# Patient Record
Sex: Female | Born: 1945 | Race: White | Hispanic: No | Marital: Single | Smoking: Former smoker
Health system: Southern US, Community
[De-identification: ages and names within clinical notes are randomized; demographics above are authoritative.]

## PROBLEM LIST (undated history)

## (undated) DIAGNOSIS — F419 Anxiety disorder, unspecified: Secondary | ICD-10-CM

## (undated) DIAGNOSIS — I10 Essential (primary) hypertension: Secondary | ICD-10-CM

## (undated) DIAGNOSIS — K219 Gastro-esophageal reflux disease without esophagitis: Secondary | ICD-10-CM

## (undated) DIAGNOSIS — E7800 Pure hypercholesterolemia, unspecified: Secondary | ICD-10-CM

## (undated) HISTORY — PX: BREAST SURGERY: SHX581

---

## 1997-12-23 ENCOUNTER — Other Ambulatory Visit: Admission: RE | Admit: 1997-12-23 | Discharge: 1997-12-23 | Payer: Self-pay | Admitting: *Deleted

## 1998-02-19 ENCOUNTER — Other Ambulatory Visit: Admission: RE | Admit: 1998-02-19 | Discharge: 1998-02-19 | Payer: Self-pay | Admitting: *Deleted

## 1998-12-29 ENCOUNTER — Other Ambulatory Visit: Admission: RE | Admit: 1998-12-29 | Discharge: 1998-12-29 | Payer: Self-pay | Admitting: *Deleted

## 2000-01-04 ENCOUNTER — Other Ambulatory Visit: Admission: RE | Admit: 2000-01-04 | Discharge: 2000-01-04 | Payer: Self-pay | Admitting: *Deleted

## 2000-01-04 ENCOUNTER — Encounter: Admission: RE | Admit: 2000-01-04 | Discharge: 2000-01-04 | Payer: Self-pay | Admitting: *Deleted

## 2000-01-04 ENCOUNTER — Encounter: Payer: Self-pay | Admitting: *Deleted

## 2001-01-28 ENCOUNTER — Other Ambulatory Visit: Admission: RE | Admit: 2001-01-28 | Discharge: 2001-01-28 | Payer: Self-pay | Admitting: *Deleted

## 2002-02-03 ENCOUNTER — Other Ambulatory Visit: Admission: RE | Admit: 2002-02-03 | Discharge: 2002-02-03 | Payer: Self-pay | Admitting: *Deleted

## 2003-02-11 ENCOUNTER — Other Ambulatory Visit: Admission: RE | Admit: 2003-02-11 | Discharge: 2003-02-11 | Payer: Self-pay | Admitting: *Deleted

## 2003-03-26 ENCOUNTER — Encounter: Admission: RE | Admit: 2003-03-26 | Discharge: 2003-03-26 | Payer: Self-pay | Admitting: *Deleted

## 2004-02-24 ENCOUNTER — Other Ambulatory Visit: Admission: RE | Admit: 2004-02-24 | Discharge: 2004-02-24 | Payer: Self-pay | Admitting: *Deleted

## 2005-05-29 ENCOUNTER — Other Ambulatory Visit: Admission: RE | Admit: 2005-05-29 | Discharge: 2005-05-29 | Payer: Self-pay | Admitting: *Deleted

## 2005-06-14 ENCOUNTER — Encounter: Admission: RE | Admit: 2005-06-14 | Discharge: 2005-06-14 | Payer: Self-pay | Admitting: *Deleted

## 2005-08-04 ENCOUNTER — Encounter: Admission: RE | Admit: 2005-08-04 | Discharge: 2005-08-04 | Payer: Self-pay | Admitting: Family Medicine

## 2006-05-30 ENCOUNTER — Other Ambulatory Visit: Admission: RE | Admit: 2006-05-30 | Discharge: 2006-05-30 | Payer: Self-pay | Admitting: *Deleted

## 2006-07-18 ENCOUNTER — Encounter: Admission: RE | Admit: 2006-07-18 | Discharge: 2006-07-18 | Payer: Self-pay | Admitting: *Deleted

## 2006-08-29 ENCOUNTER — Encounter: Admission: RE | Admit: 2006-08-29 | Discharge: 2006-08-29 | Payer: Self-pay | Admitting: *Deleted

## 2007-06-06 ENCOUNTER — Other Ambulatory Visit: Admission: RE | Admit: 2007-06-06 | Discharge: 2007-06-06 | Payer: Self-pay | Admitting: *Deleted

## 2007-07-24 ENCOUNTER — Encounter: Admission: RE | Admit: 2007-07-24 | Discharge: 2007-07-24 | Payer: Self-pay | Admitting: *Deleted

## 2008-04-22 IMAGING — MG MM SCREEN MAMMOGRAM BILATERAL
6 series · 6 of 6 positions shown · non-contrast
Comparison: none

DG SCREEN MAMMOGRAM BILATERAL
Bilateral CC and MLO view(s) were taken.

DIGITAL SCREENING MAMMOGRAM WITH CAD:
There is a fibrofatty pattern.  No masses or malignant type calcifications are identified.  
Compared with prior studies.

[R CC]
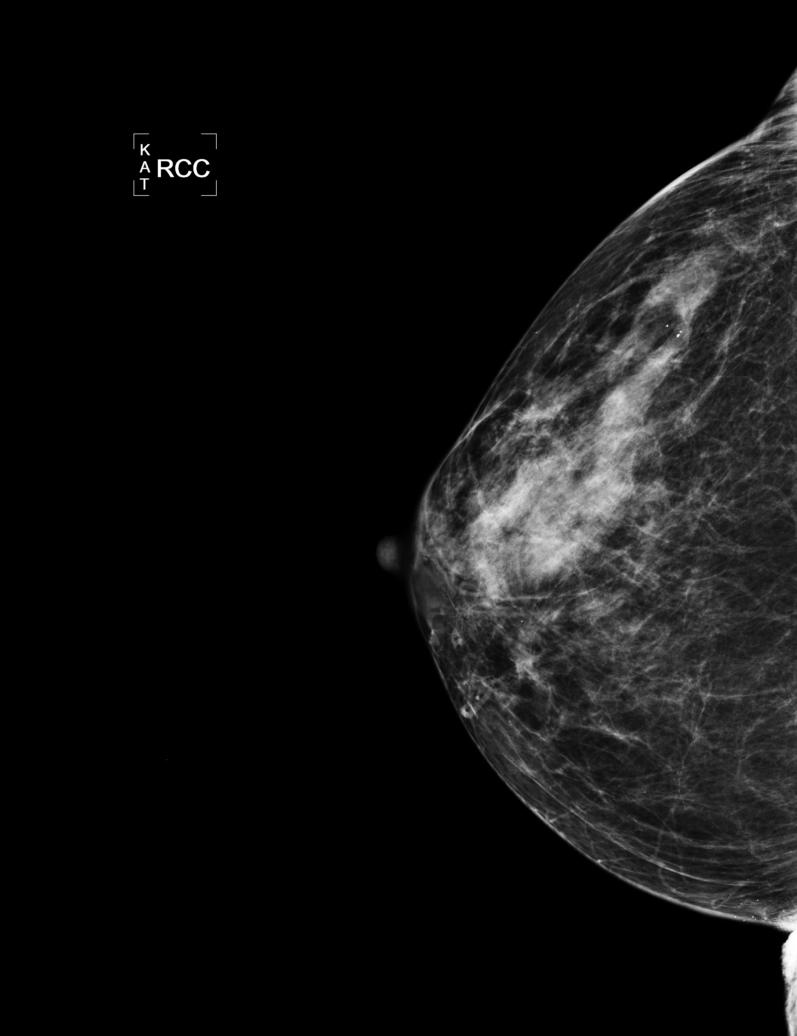

[L CC]
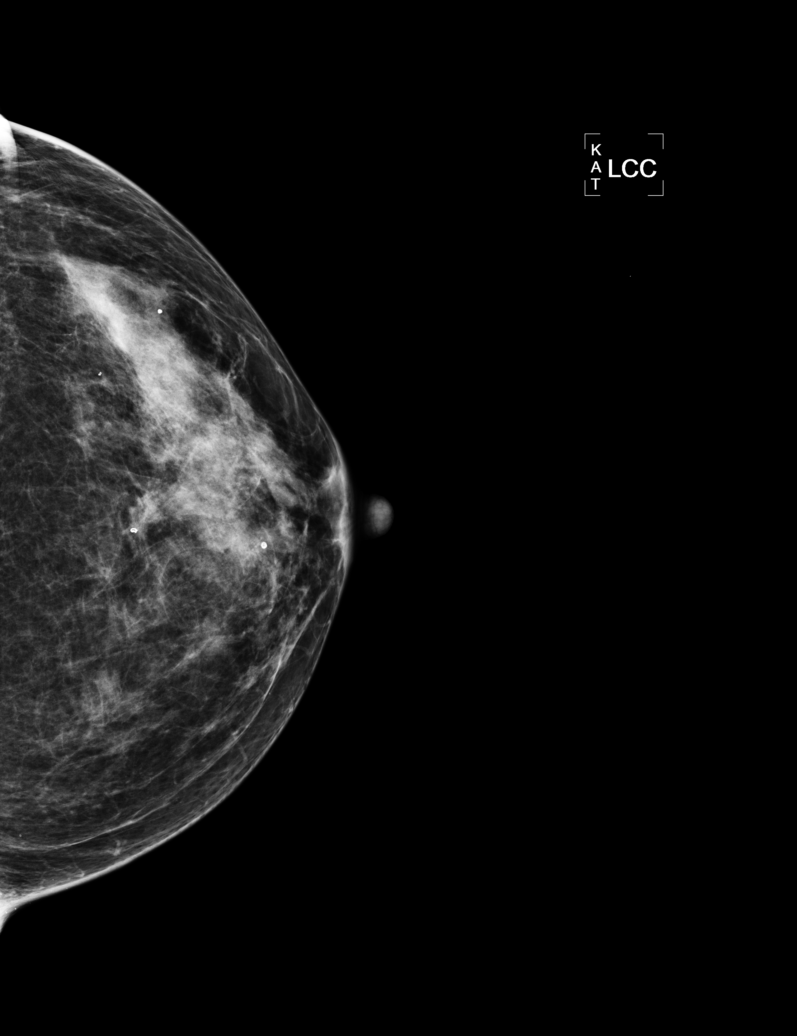

[L MLO (1 of 2)]
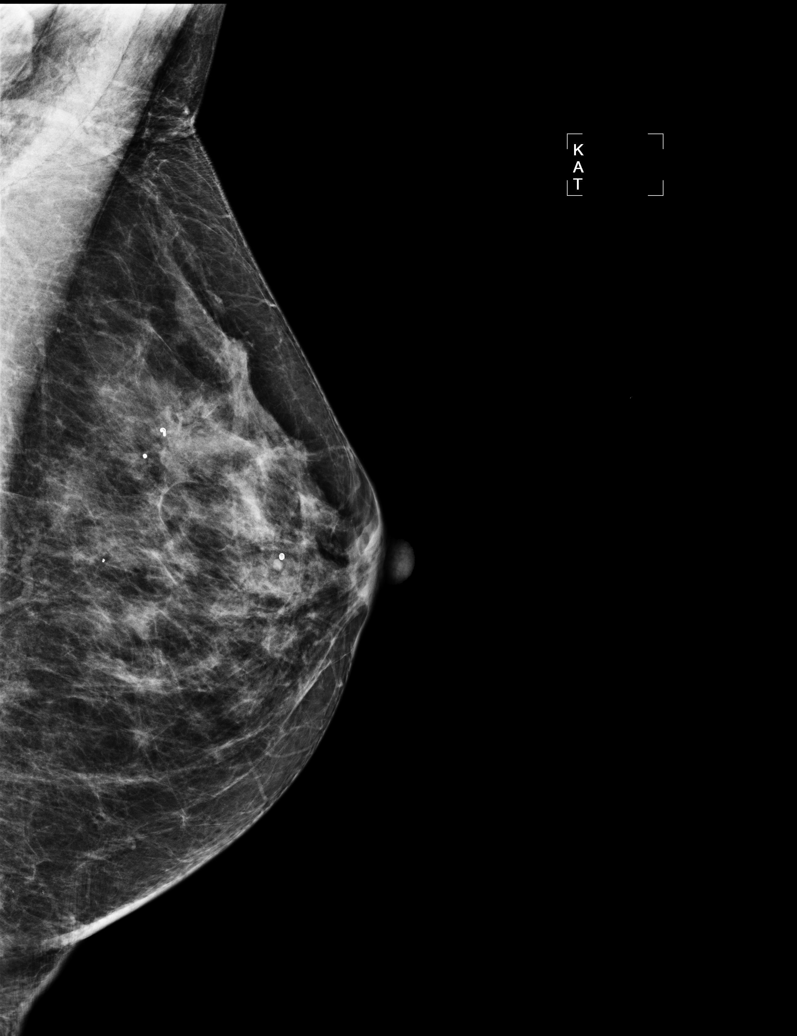

[R MLO (1 of 2)]
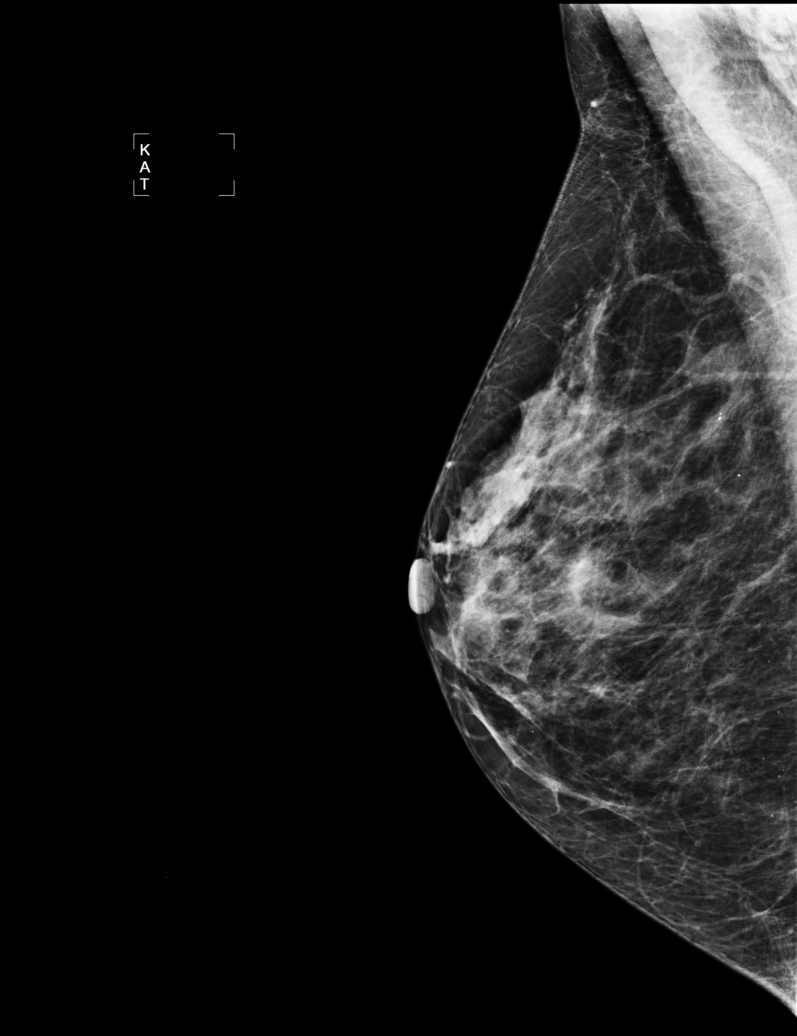

[R MLO (2 of 2)]
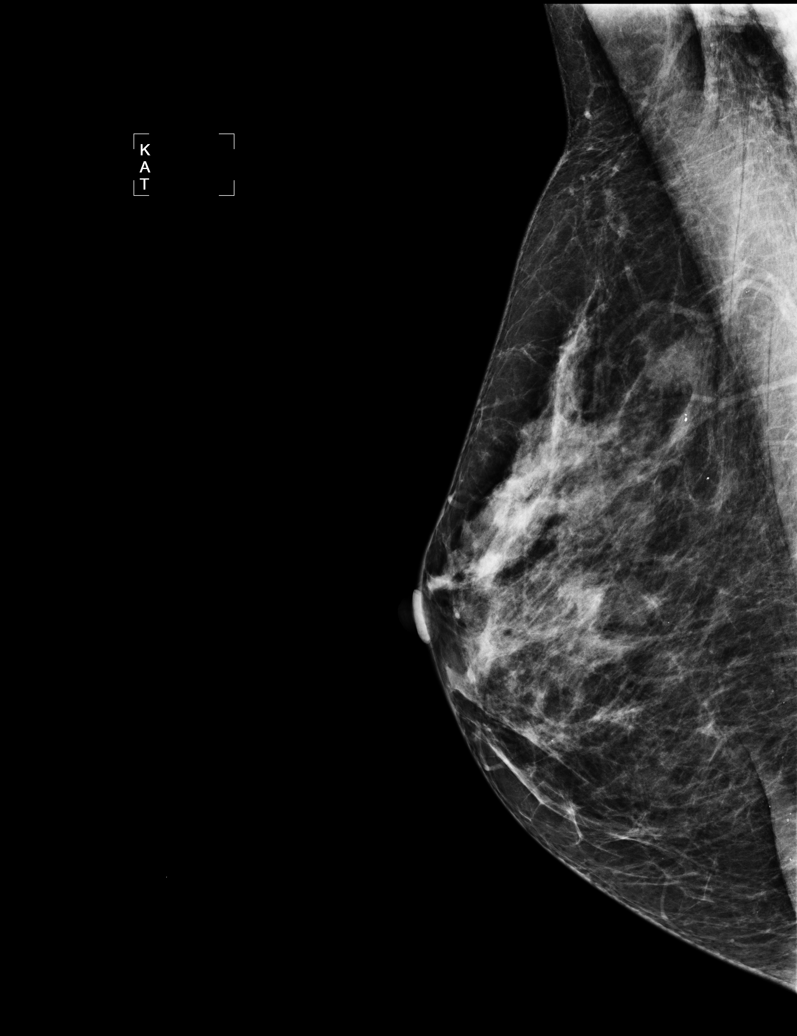

[L MLO (2 of 2)]
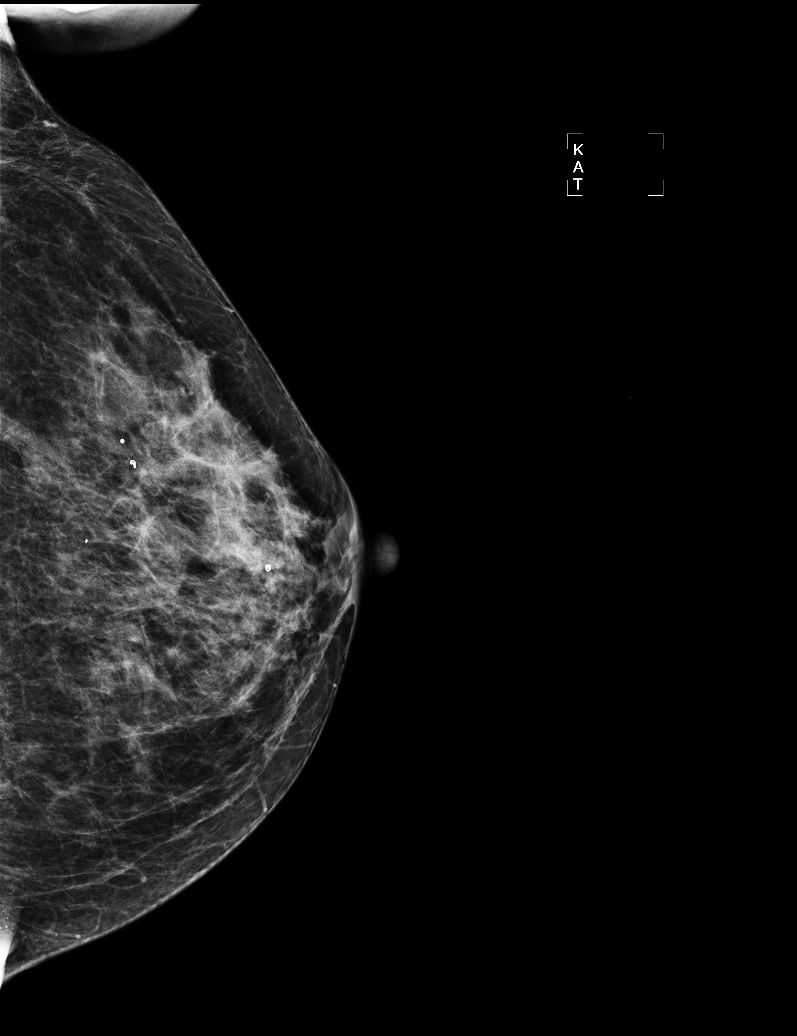

[6 of 6 positions shown; findings below may reference images not displayed]

IMPRESSION: No specific mammographic evidence of malignancy.  Next screening mammogram is recommended in one 
year.

ASSESSMENT: Negative - BI-RADS 1

Screening mammogram in 1 year.
ANALYZED BY COMPUTER AIDED DETECTION. , THIS PROCEDURE WAS A DIGITAL MAMMOGRAM.

## 2008-06-18 ENCOUNTER — Other Ambulatory Visit: Admission: RE | Admit: 2008-06-18 | Discharge: 2008-06-18 | Payer: Self-pay | Admitting: Obstetrics & Gynecology

## 2008-09-09 ENCOUNTER — Encounter: Admission: RE | Admit: 2008-09-09 | Discharge: 2008-09-09 | Payer: Self-pay | Admitting: Obstetrics & Gynecology

## 2008-09-29 ENCOUNTER — Ambulatory Visit (HOSPITAL_BASED_OUTPATIENT_CLINIC_OR_DEPARTMENT_OTHER): Admission: RE | Admit: 2008-09-29 | Discharge: 2008-09-29 | Payer: Self-pay | Admitting: Obstetrics & Gynecology

## 2008-09-29 ENCOUNTER — Ambulatory Visit: Payer: Self-pay | Admitting: Diagnostic Radiology

## 2008-11-22 ENCOUNTER — Ambulatory Visit: Payer: Self-pay | Admitting: Diagnostic Radiology

## 2008-11-22 ENCOUNTER — Emergency Department (HOSPITAL_BASED_OUTPATIENT_CLINIC_OR_DEPARTMENT_OTHER): Admission: EM | Admit: 2008-11-22 | Discharge: 2008-11-22 | Payer: Self-pay | Admitting: Emergency Medicine

## 2009-10-06 ENCOUNTER — Encounter: Admission: RE | Admit: 2009-10-06 | Discharge: 2009-10-06 | Payer: Self-pay | Admitting: Obstetrics & Gynecology

## 2009-10-06 ENCOUNTER — Ambulatory Visit: Payer: Self-pay | Admitting: Radiology

## 2009-10-06 ENCOUNTER — Ambulatory Visit (HOSPITAL_BASED_OUTPATIENT_CLINIC_OR_DEPARTMENT_OTHER): Admission: RE | Admit: 2009-10-06 | Discharge: 2009-10-06 | Payer: Self-pay | Admitting: Obstetrics & Gynecology

## 2010-08-14 LAB — DIFFERENTIAL
Basophils Relative: 1 % (ref 0–1)
Eosinophils Absolute: 0 10*3/uL (ref 0.0–0.7)
Lymphocytes Relative: 19 % (ref 12–46)
Lymphs Abs: 1 10*3/uL (ref 0.7–4.0)
Neutro Abs: 3.9 10*3/uL (ref 1.7–7.7)

## 2010-08-14 LAB — BASIC METABOLIC PANEL
BUN: 19 mg/dL (ref 6–23)
Calcium: 9.8 mg/dL (ref 8.4–10.5)
Chloride: 105 mEq/L (ref 96–112)
Creatinine, Ser: 0.7 mg/dL (ref 0.4–1.2)
GFR calc Af Amer: >60 mL/min (ref >60)
Potassium: 4.1 mEq/L (ref 3.5–5.1)
Sodium: 143 mEq/L (ref 135–145)

## 2010-08-14 LAB — CBC
Hemoglobin: 14.4 g/dL (ref 12.0–15.0)
MCHC: 33.2 g/dL (ref 30.0–36.0)
RBC: 4.72 MIL/uL (ref 3.87–5.11)
WBC: 5.3 10*3/uL (ref 4.0–10.5)

## 2010-08-15 ENCOUNTER — Other Ambulatory Visit: Payer: Self-pay | Admitting: Obstetrics & Gynecology

## 2010-08-15 DIAGNOSIS — Z1231 Encounter for screening mammogram for malignant neoplasm of breast: Secondary | ICD-10-CM

## 2010-08-17 ENCOUNTER — Other Ambulatory Visit: Payer: Self-pay | Admitting: Obstetrics & Gynecology

## 2010-08-17 DIAGNOSIS — Z1231 Encounter for screening mammogram for malignant neoplasm of breast: Secondary | ICD-10-CM

## 2010-10-19 ENCOUNTER — Ambulatory Visit
Admission: RE | Admit: 2010-10-19 | Discharge: 2010-10-19 | Disposition: A | Discharge status: Home / Self Care | Payer: Medicare Other | Source: Ambulatory Visit | Attending: Obstetrics & Gynecology | Admitting: Obstetrics & Gynecology

## 2010-10-19 DIAGNOSIS — Z1231 Encounter for screening mammogram for malignant neoplasm of breast: Secondary | ICD-10-CM

## 2011-07-20 ENCOUNTER — Other Ambulatory Visit: Payer: Self-pay | Admitting: Obstetrics & Gynecology

## 2011-07-20 DIAGNOSIS — Z1231 Encounter for screening mammogram for malignant neoplasm of breast: Secondary | ICD-10-CM

## 2011-09-14 ENCOUNTER — Other Ambulatory Visit: Payer: Self-pay | Admitting: Obstetrics & Gynecology

## 2011-10-25 ENCOUNTER — Ambulatory Visit: Payer: Medicare Other

## 2011-11-22 ENCOUNTER — Ambulatory Visit
Admission: RE | Admit: 2011-11-22 | Discharge: 2011-11-22 | Disposition: A | Discharge status: Home / Self Care | Payer: Medicare Other | Source: Ambulatory Visit | Attending: Obstetrics & Gynecology | Admitting: Obstetrics & Gynecology

## 2011-11-22 DIAGNOSIS — Z1231 Encounter for screening mammogram for malignant neoplasm of breast: Secondary | ICD-10-CM

## 2011-11-28 ENCOUNTER — Other Ambulatory Visit: Payer: Self-pay | Admitting: Obstetrics & Gynecology

## 2011-11-28 DIAGNOSIS — R928 Other abnormal and inconclusive findings on diagnostic imaging of breast: Secondary | ICD-10-CM

## 2011-12-13 ENCOUNTER — Other Ambulatory Visit: Payer: Medicare Other

## 2011-12-20 ENCOUNTER — Ambulatory Visit
Admission: RE | Admit: 2011-12-20 | Discharge: 2011-12-20 | Disposition: A | Discharge status: Home / Self Care | Payer: Medicare Other | Source: Ambulatory Visit | Attending: Obstetrics & Gynecology | Admitting: Obstetrics & Gynecology

## 2011-12-20 ENCOUNTER — Other Ambulatory Visit: Payer: Self-pay | Admitting: Obstetrics & Gynecology

## 2011-12-20 DIAGNOSIS — R928 Other abnormal and inconclusive findings on diagnostic imaging of breast: Secondary | ICD-10-CM

## 2012-06-07 ENCOUNTER — Other Ambulatory Visit: Payer: Self-pay | Admitting: *Deleted

## 2012-06-07 DIAGNOSIS — N6310 Unspecified lump in the right breast, unspecified quadrant: Secondary | ICD-10-CM

## 2012-06-07 DIAGNOSIS — Z09 Encounter for follow-up examination after completed treatment for conditions other than malignant neoplasm: Secondary | ICD-10-CM

## 2012-07-03 ENCOUNTER — Other Ambulatory Visit: Payer: Medicare Other

## 2012-07-24 ENCOUNTER — Other Ambulatory Visit: Payer: Medicare Other

## 2012-08-14 ENCOUNTER — Other Ambulatory Visit: Payer: Medicare Other

## 2012-09-04 ENCOUNTER — Ambulatory Visit
Admission: RE | Admit: 2012-09-04 | Discharge: 2012-09-04 | Disposition: A | Discharge status: Home / Self Care | Payer: Medicare Other | Source: Ambulatory Visit | Attending: *Deleted | Admitting: *Deleted

## 2012-09-04 DIAGNOSIS — N6310 Unspecified lump in the right breast, unspecified quadrant: Secondary | ICD-10-CM

## 2012-10-25 ENCOUNTER — Other Ambulatory Visit: Payer: Self-pay | Admitting: Obstetrics & Gynecology

## 2012-10-25 DIAGNOSIS — M810 Age-related osteoporosis without current pathological fracture: Secondary | ICD-10-CM

## 2012-10-25 DIAGNOSIS — Z1231 Encounter for screening mammogram for malignant neoplasm of breast: Secondary | ICD-10-CM

## 2012-11-27 ENCOUNTER — Other Ambulatory Visit: Payer: Self-pay | Admitting: *Deleted

## 2012-11-27 ENCOUNTER — Ambulatory Visit
Admission: RE | Admit: 2012-11-27 | Discharge: 2012-11-27 | Disposition: A | Discharge status: Home / Self Care | Payer: Medicare Other | Source: Ambulatory Visit | Attending: Obstetrics & Gynecology | Admitting: Obstetrics & Gynecology

## 2012-11-27 DIAGNOSIS — M810 Age-related osteoporosis without current pathological fracture: Secondary | ICD-10-CM

## 2012-11-27 DIAGNOSIS — Z1231 Encounter for screening mammogram for malignant neoplasm of breast: Secondary | ICD-10-CM

## 2012-12-09 ENCOUNTER — Telehealth: Payer: Self-pay | Admitting: Obstetrics & Gynecology

## 2012-12-09 NOTE — Telephone Encounter (Signed)
Regarding bone  density results

## 2012-12-09 NOTE — Telephone Encounter (Signed)
Left message on CB#VM of need to return call concerning BD results.

## 2012-12-10 NOTE — Telephone Encounter (Signed)
Spoke with Dr Zara Chess office, copy of BMD faxed to their office and they will take care of this for her. Patient notified, will call back prn.

## 2012-12-10 NOTE — Telephone Encounter (Signed)
Patient requesting copy of her DEXA Scan report be sent to Dr. Marton Redwood office so she can begin Tx  For osteoporosis. Information given to [facility] to check on .

## 2013-01-28 ENCOUNTER — Other Ambulatory Visit: Payer: Self-pay | Admitting: Nurse Practitioner

## 2013-01-28 ENCOUNTER — Telehealth: Payer: Self-pay | Admitting: Nurse Practitioner

## 2013-01-28 NOTE — Telephone Encounter (Signed)
I tried to put order in for Acyclovir and it would not let me put this is at 200 mg - are you sure it was 200 mg or 400 mg? Ok to have 180 tablets.

## 2013-01-28 NOTE — Telephone Encounter (Signed)
John from [facility] is calling to get a refill for acyclovir 200mg  capsul quanity of 180 for 90 day supply

## 2013-01-28 NOTE — Telephone Encounter (Signed)
Please advise Ms. Rachel Lane patient does not have a AEX scheduled, according to her paper chart patient was having insurance issues last year and wasn't sure if she was going to be able to have a AEX. She did have a AEX scheduled for 07/17/12 but she cancelled because it wasn't going to be covered  Please advise  (Chart In Your Door)

## 2013-01-29 MED ORDER — ACYCLOVIR 200 MG PO CAPS: ORAL_CAPSULE | ORAL | Status: DC

## 2013-01-29 NOTE — Telephone Encounter (Signed)
I put the order in looking in the patient's chart it was for 200 mg  Acyclovir 200 mg #180/0rfs sent to [facility].

## 2013-07-13 ENCOUNTER — Other Ambulatory Visit: Payer: Self-pay | Admitting: Nurse Practitioner

## 2013-07-14 NOTE — Telephone Encounter (Signed)
AEX scheduled for 07/17/12 but she cancelled - Insurance problems Last refill 01/29/2013 #180/ 0 refills No future appt's scheduled.   Will refill once for one month. -Pt will need AEX for further refills.

## 2013-10-23 ENCOUNTER — Other Ambulatory Visit: Payer: Self-pay

## 2013-10-23 DIAGNOSIS — Z1231 Encounter for screening mammogram for malignant neoplasm of breast: Secondary | ICD-10-CM

## 2013-12-10 ENCOUNTER — Ambulatory Visit
Admission: RE | Admit: 2013-12-10 | Discharge: 2013-12-10 | Disposition: A | Discharge status: Home / Self Care | Payer: Medicare Other | Source: Ambulatory Visit

## 2013-12-10 DIAGNOSIS — Z1231 Encounter for screening mammogram for malignant neoplasm of breast: Secondary | ICD-10-CM

## 2014-01-18 ENCOUNTER — Encounter (HOSPITAL_BASED_OUTPATIENT_CLINIC_OR_DEPARTMENT_OTHER): Payer: Self-pay | Admitting: Emergency Medicine

## 2014-01-18 ENCOUNTER — Emergency Department (HOSPITAL_BASED_OUTPATIENT_CLINIC_OR_DEPARTMENT_OTHER)
Admission: EM | Admit: 2014-01-18 | Discharge: 2014-01-18 | Disposition: A | Discharge status: Home / Self Care | Payer: Medicare Other | Attending: Emergency Medicine | Admitting: Emergency Medicine

## 2014-01-18 DIAGNOSIS — R109 Unspecified abdominal pain: Secondary | ICD-10-CM | POA: Insufficient documentation

## 2014-01-18 DIAGNOSIS — Z79899 Other long term (current) drug therapy: Secondary | ICD-10-CM | POA: Insufficient documentation

## 2014-01-18 DIAGNOSIS — R1084 Generalized abdominal pain: Secondary | ICD-10-CM

## 2014-01-18 DIAGNOSIS — K2990 Gastroduodenitis, unspecified, without bleeding: Secondary | ICD-10-CM | POA: Diagnosis not present

## 2014-01-18 DIAGNOSIS — K2970 Gastritis, unspecified, without bleeding: Secondary | ICD-10-CM | POA: Diagnosis not present

## 2014-01-18 DIAGNOSIS — R51 Headache: Secondary | ICD-10-CM | POA: Insufficient documentation

## 2014-01-18 LAB — COMPREHENSIVE METABOLIC PANEL
ALT: 17 U/L (ref 0–35)
AST: 20 U/L (ref 0–37)
Albumin: 3.6 g/dL (ref 3.5–5.2)
Alkaline Phosphatase: 79 U/L (ref 39–117)
Anion gap: 12 (ref 5–15)
BUN: 20 mg/dL (ref 6–23)
CALCIUM: 10.6 mg/dL — AB (ref 8.4–10.5)
CO2: 27 meq/L (ref 19–32)
CREATININE: 0.6 mg/dL (ref 0.50–1.10)
Chloride: 101 mEq/L (ref 96–112)
GFR calc Af Amer: >90 mL/min (ref >90)
GFR calc non Af Amer: >90 mL/min (ref >90)
Glucose, Bld: 131 mg/dL — ABNORMAL HIGH (ref 70–99)
Potassium: 3.9 mEq/L (ref 3.7–5.3)
Sodium: 140 mEq/L (ref 137–147)
Total Bilirubin: 0.3 mg/dL (ref 0.3–1.2)
Total Protein: 6.7 g/dL (ref 6.0–8.3)

## 2014-01-18 LAB — CBC WITH DIFFERENTIAL/PLATELET
BASOS ABS: 0 10*3/uL (ref 0.0–0.1)
BASOS PCT: 0 % (ref 0–1)
EOS PCT: 0 % (ref 0–5)
Eosinophils Absolute: 0 10*3/uL (ref 0.0–0.7)
HEMATOCRIT: 42.2 % (ref 36.0–46.0)
Hemoglobin: 14.2 g/dL (ref 12.0–15.0)
LYMPHS PCT: 4 % — AB (ref 12–46)
Lymphs Abs: 0.5 10*3/uL — ABNORMAL LOW (ref 0.7–4.0)
MCH: 30.5 pg (ref 26.0–34.0)
MCHC: 33.6 g/dL (ref 30.0–36.0)
MCV: 90.6 fL (ref 78.0–100.0)
MONO ABS: 0.9 10*3/uL (ref 0.1–1.0)
Monocytes Relative: 7 % (ref 3–12)
Neutro Abs: 12.1 10*3/uL — ABNORMAL HIGH (ref 1.7–7.7)
Neutrophils Relative %: 89 % — ABNORMAL HIGH (ref 43–77)
Platelets: 385 10*3/uL (ref 150–400)
RBC: 4.66 MIL/uL (ref 3.87–5.11)
RDW: 13.4 % (ref 11.5–15.5)
WBC: 13.6 10*3/uL — ABNORMAL HIGH (ref 4.0–10.5)

## 2014-01-18 LAB — LIPASE, BLOOD: LIPASE: 50 U/L (ref 11–59)

## 2014-01-18 MED ORDER — OMEPRAZOLE 20 MG PO CPDR: 20.0000 mg | DELAYED_RELEASE_CAPSULE | Freq: Two times a day (BID) | ORAL | Status: DC

## 2014-01-18 MED ORDER — PANTOPRAZOLE SODIUM 40 MG IV SOLR
40.0000 mg | Freq: Once | INTRAVENOUS | Status: AC
  Administered 2014-01-18: 40 mg via INTRAVENOUS
  Filled 2014-01-18: qty 40

## 2014-01-18 MED ORDER — MORPHINE SULFATE 4 MG/ML IJ SOLN
4.0000 mg | INTRAMUSCULAR | Status: DC | PRN
  Administered 2014-01-18: 4 mg via INTRAVENOUS
  Filled 2014-01-18: qty 1

## 2014-01-18 MED ORDER — ONDANSETRON HCL 4 MG/2ML IJ SOLN
4.0000 mg | Freq: Once | INTRAMUSCULAR | Status: AC
Start: 2014-01-18 — End: 2014-01-18
  Administered 2014-01-18: 4 mg via INTRAVENOUS
  Filled 2014-01-18: qty 2

## 2014-01-18 MED ORDER — ONDANSETRON HCL 4 MG/2ML IJ SOLN: 4.0000 mg | Freq: Once | INTRAMUSCULAR | Status: DC

## 2014-01-18 MED ORDER — GI COCKTAIL ~~LOC~~
30.0000 mL | Freq: Once | ORAL | Status: AC
  Administered 2014-01-18: 30 mL via ORAL
  Filled 2014-01-18: qty 30

## 2014-01-18 MED ORDER — SUCRALFATE 1 G PO TABS: 1.0000 g | ORAL_TABLET | Freq: Four times a day (QID) | ORAL | Status: DC

## 2014-01-18 MED ORDER — HYDROCODONE-ACETAMINOPHEN 5-325 MG PO TABS: 1.0000 | ORAL_TABLET | ORAL | Status: DC | PRN

## 2014-01-18 MED ORDER — SUCRALFATE 1 G PO TABS
1.0000 g | ORAL_TABLET | Freq: Once | ORAL | Status: AC
Start: 2014-01-18 — End: 2014-01-18
  Administered 2014-01-18: 1 g via ORAL
  Filled 2014-01-18: qty 1

## 2014-01-18 MED ORDER — ONDANSETRON HCL 4 MG PO TABS: 4.0000 mg | ORAL_TABLET | Freq: Four times a day (QID) | ORAL | Status: DC

## 2014-01-18 NOTE — ED Provider Notes (Signed)
CSN: [id number]     Arrival date & time 01/18/14  1516 History   First MD Initiated Contact with Patient 01/18/14 1619     This chart was scribed for Rolland Porter, MD by Tonye Royalty, ED Scribe. This patient was seen in room MHOTF/OTF and the patient's care was started at 4:37 PM.   Chief Complaint  Patient presents with  . Abdominal Pain   The history is provided by the patient. No language interpreter was used.    HPI Comments: Rachel Lane is a 68 y.o. female who presents to the Emergency Department complaining of constant supraumbilical and epigastric abdominal pain upon waking at 5 or 6AM this morning. She rates her pain at 8/10. She states her pain is worse when bending. She reports eating oatmeal around 1:30PM today and states it made the pain worse. She states that she normally has some abdominal pain after eating fatty or greasy foods. She denies history of abdominal problems or surgeries. She reports associated headache and nausea. She reports a lage bowel movement and a small bowel movement today. She reports recently taking Xanax and sleeping pills and reports stress. She reports history of high blood pressure for which she sometimes takes medication. She denies particular use of ASA, motrin, or alcohol. She states she did drink a lot of coffee last night. She denies vomiting, diarrhea, constipation, fever, chills, cough, SOB, or urinary abnormalities.    History reviewed. No pertinent past medical history. History reviewed. No pertinent past surgical history. History reviewed. No pertinent family history. History  Substance Use Topics  . Smoking status: Never Smoker   . Smokeless tobacco: Not on file  . Alcohol Use: Yes   OB History   Grav Para Term Preterm Abortions TAB SAB Ect Mult Living                 Review of Systems  Constitutional: Negative for fever, chills, diaphoresis, appetite change and fatigue.  HENT: Negative for congestion, mouth sores, sore throat and  trouble swallowing.   Eyes: Negative for visual disturbance.  Respiratory: Negative for cough, chest tightness, shortness of breath and wheezing.   Cardiovascular: Negative for chest pain.  Gastrointestinal: Positive for nausea and abdominal pain. Negative for vomiting, diarrhea, constipation and abdominal distention.  Endocrine: Negative for polydipsia, polyphagia and polyuria.  Genitourinary: Negative for dysuria, urgency, frequency, hematuria, decreased urine volume and difficulty urinating.  Musculoskeletal: Negative for gait problem.  Skin: Negative for color change, pallor and rash.  Neurological: Positive for headaches. Negative for dizziness, syncope and light-headedness.  Hematological: Does not bruise/bleed easily.  Psychiatric/Behavioral: Negative for behavioral problems and confusion.      Allergies  Review of patient's allergies indicates no known allergies.  Home Medications   Prior to Admission medications   Medication Sig Start Date End Date Taking? Authorizing Provider  acyclovir (ZOVIRAX) 200 MG capsule Take 1 capsule by mouth  twice daily    Lauro Franklin, FNP  HYDROcodone-acetaminophen (NORCO/VICODIN) 5-325 MG per tablet Take 1 tablet by mouth every 4 (four) hours as needed. 01/18/14   Rolland Porter, MD  omeprazole (PRILOSEC) 20 MG capsule Take 1 capsule (20 mg total) by mouth 2 (two) times daily. 01/18/14   Rolland Porter, MD  ondansetron (ZOFRAN) 4 MG tablet Take 1 tablet (4 mg total) by mouth every 6 (six) hours. 01/18/14   Rolland Porter, MD  sucralfate (CARAFATE) 1 G tablet Take 1 tablet (1 g total) by mouth 4 (four) times daily. 01/18/14  Rolland Porter, MD   BP 121/74  Pulse 67  Temp(Src) 97.8 F (36.6 C) (Oral)  Resp 18  Wt 106 lb (48.081 kg)  SpO2 100% Physical Exam  Nursing note and vitals reviewed. Constitutional: She is oriented to person, place, and time. She appears well-developed and well-nourished. No distress.  HENT:  Head: Normocephalic.  Eyes:  Conjunctivae are normal. Pupils are equal, round, and reactive to light. No scleral icterus.  Neck: Normal range of motion. Neck supple. No thyromegaly present.  Cardiovascular: Normal rate and regular rhythm.  Exam reveals no gallop and no friction rub.   No murmur heard. Pulmonary/Chest: Effort normal and breath sounds normal. No respiratory distress. She has no wheezes. She has no rales.  Abdominal: Soft. Bowel sounds are normal. She exhibits no distension. There is tenderness (diffuse upper abdominal tenderess without peritoneal irritation'). There is no rebound.  Musculoskeletal: Normal range of motion.  Neurological: She is alert and oriented to person, place, and time.  Skin: Skin is warm and dry. No rash noted.  Psychiatric: She has a normal mood and affect. Her behavior is normal.    ED Course  Procedures (including critical care time) Labs Review Labs Reviewed  CBC WITH DIFFERENTIAL - Abnormal; Notable for the following:    WBC 13.6 (*)    Neutrophils Relative % 89 (*)    Neutro Abs 12.1 (*)    Lymphocytes Relative 4 (*)    Lymphs Abs 0.5 (*)    All other components within normal limits  COMPREHENSIVE METABOLIC PANEL - Abnormal; Notable for the following:    Glucose, Bld 131 (*)    Calcium 10.6 (*)    All other components within normal limits  LIPASE, BLOOD    Imaging Review No results found.   EKG Interpretation None      MDM   Final diagnoses:  Gastritis  Generalized abdominal pain    I personally performed the services described in this documentation, which was scribed in my presence. The recorded information has been reviewed and is accurate.    Rolland Porter, MD 01/23/14 0000

## 2014-01-18 NOTE — ED Notes (Signed)
PT discharged to home with family. NAD. 

## 2014-01-18 NOTE — ED Notes (Signed)
Reports diffused abdominal pain with nausea. No vomiting. Denies dysuria. 2 BM today, that were normal

## 2014-01-18 NOTE — Discharge Instructions (Signed)

## 2017-12-02 ENCOUNTER — Other Ambulatory Visit: Payer: Self-pay

## 2017-12-02 ENCOUNTER — Encounter (HOSPITAL_BASED_OUTPATIENT_CLINIC_OR_DEPARTMENT_OTHER): Payer: Self-pay | Admitting: Emergency Medicine

## 2017-12-02 ENCOUNTER — Emergency Department (HOSPITAL_BASED_OUTPATIENT_CLINIC_OR_DEPARTMENT_OTHER)
Admission: EM | Admit: 2017-12-02 | Discharge: 2017-12-02 | Disposition: A | Discharge status: Home / Self Care | Payer: Medicare Other | Attending: Emergency Medicine | Admitting: Emergency Medicine

## 2017-12-02 ENCOUNTER — Emergency Department (HOSPITAL_BASED_OUTPATIENT_CLINIC_OR_DEPARTMENT_OTHER): Payer: Medicare Other

## 2017-12-02 DIAGNOSIS — E7800 Pure hypercholesterolemia, unspecified: Secondary | ICD-10-CM | POA: Insufficient documentation

## 2017-12-02 DIAGNOSIS — R1013 Epigastric pain: Secondary | ICD-10-CM

## 2017-12-02 DIAGNOSIS — K8020 Calculus of gallbladder without cholecystitis without obstruction: Secondary | ICD-10-CM | POA: Insufficient documentation

## 2017-12-02 DIAGNOSIS — Z79899 Other long term (current) drug therapy: Secondary | ICD-10-CM | POA: Diagnosis not present

## 2017-12-02 DIAGNOSIS — R1011 Right upper quadrant pain: Secondary | ICD-10-CM

## 2017-12-02 DIAGNOSIS — I10 Essential (primary) hypertension: Secondary | ICD-10-CM | POA: Diagnosis not present

## 2017-12-02 DIAGNOSIS — K8050 Calculus of bile duct without cholangitis or cholecystitis without obstruction: Secondary | ICD-10-CM

## 2017-12-02 HISTORY — DX: Essential (primary) hypertension: I10

## 2017-12-02 HISTORY — DX: Anxiety disorder, unspecified: F41.9

## 2017-12-02 HISTORY — DX: Pure hypercholesterolemia, unspecified: E78.00

## 2017-12-02 LAB — URINALYSIS, ROUTINE W REFLEX MICROSCOPIC
Bilirubin Urine: NEGATIVE
Glucose, UA: NEGATIVE mg/dL
Ketones, ur: NEGATIVE mg/dL
Leukocytes, UA: NEGATIVE
Nitrite: NEGATIVE
Protein, ur: NEGATIVE mg/dL
Specific Gravity, Urine: <1.005 — ABNORMAL LOW (ref 1.005–1.030)
pH: 5.5 (ref 5.0–8.0)

## 2017-12-02 LAB — CBC WITH DIFFERENTIAL/PLATELET
Basophils Absolute: 0 10*3/uL (ref 0.0–0.1)
Basophils Relative: 0 %
Eosinophils Absolute: 0.1 10*3/uL (ref 0.0–0.7)
Eosinophils Relative: 2 %
HCT: 41.6 % (ref 36.0–46.0)
Hemoglobin: 13.7 g/dL (ref 12.0–15.0)
Lymphocytes Relative: 24 %
Lymphs Abs: 1.8 10*3/uL (ref 0.7–4.0)
MCH: 30.6 pg (ref 26.0–34.0)
MCHC: 32.9 g/dL (ref 30.0–36.0)
MCV: 92.9 fL (ref 78.0–100.0)
Monocytes Absolute: 0.8 10*3/uL (ref 0.1–1.0)
Monocytes Relative: 10 %
Neutro Abs: 4.8 10*3/uL (ref 1.7–7.7)
Neutrophils Relative %: 64 %
Platelets: 422 10*3/uL — ABNORMAL HIGH (ref 150–400)
RBC: 4.48 MIL/uL (ref 3.87–5.11)
RDW: 14.2 % (ref 11.5–15.5)
WBC: 7.6 10*3/uL (ref 4.0–10.5)

## 2017-12-02 LAB — COMPREHENSIVE METABOLIC PANEL
ALT: 21 U/L (ref 0–44)
AST: 22 U/L (ref 15–41)
Albumin: 3.7 g/dL (ref 3.5–5.0)
Alkaline Phosphatase: 35 U/L — ABNORMAL LOW (ref 38–126)
Anion gap: 8 (ref 5–15)
BUN: 24 mg/dL — ABNORMAL HIGH (ref 8–23)
CO2: 26 mmol/L (ref 22–32)
Calcium: 9.2 mg/dL (ref 8.9–10.3)
Chloride: 104 mmol/L (ref 98–111)
Creatinine, Ser: 0.74 mg/dL (ref 0.44–1.00)
GFR calc Af Amer: >60 mL/min (ref >60)
GFR calc non Af Amer: >60 mL/min (ref >60)
Glucose, Bld: 89 mg/dL (ref 70–99)
Potassium: 3.8 mmol/L (ref 3.5–5.1)
Sodium: 138 mmol/L (ref 135–145)
Total Bilirubin: 0.3 mg/dL (ref 0.3–1.2)
Total Protein: 6.4 g/dL — ABNORMAL LOW (ref 6.5–8.1)

## 2017-12-02 LAB — URINALYSIS, MICROSCOPIC (REFLEX): WBC, UA: NONE SEEN WBC/hpf (ref 0–5)

## 2017-12-02 LAB — LIPASE, BLOOD: Lipase: 61 U/L — ABNORMAL HIGH (ref 11–51)

## 2017-12-02 MED ORDER — HYDROCODONE-ACETAMINOPHEN 5-325 MG PO TABS: 1.0000 | ORAL_TABLET | ORAL | 0 refills | Status: AC | PRN

## 2017-12-02 MED ORDER — ONDANSETRON HCL 4 MG PO TABS: 4.0000 mg | ORAL_TABLET | Freq: Four times a day (QID) | ORAL | 0 refills | Status: AC | PRN

## 2017-12-02 NOTE — ED Triage Notes (Signed)
Pt c/o abd pain after eating x 2 days.

## 2017-12-05 NOTE — ED Notes (Signed)
12/05/2017,  Pt. Called fore urine culture results,  Explained to pt. Our protocol on how our urine cultures results.  She verbalized understanding.

## 2017-12-09 NOTE — ED Provider Notes (Signed)
[facility] EMERGENCY DEPARTMENT Provider Note   CSN: [id number] Arrival date & time: 12/02/17  1508     History   Chief Complaint Chief Complaint  Patient presents with  . Abdominal Pain    HPI Rachel Lane is a 72 y.o. female.  HPI   72 year old female with abdominal pain.  Onset about 2 days ago.  Consistently has it after eating.  Pain is in the epigastric right upper quadrant.  Sometimes radiates through to her back.  Says she feels full quickly.  Mild nausea.  No vomiting.  No fevers or chills.  No urinary complaints.  Past Medical History:  Diagnosis Date  . Anxiety   . High cholesterol   . Hypertension     There are no active problems to display for this patient.   Past Surgical History:  Procedure Laterality Date  . BREAST SURGERY       OB History   None      Home Medications    Prior to Admission medications   Medication Sig Start Date End Date Taking? Authorizing Provider  ALPRAZolam Prudy Feeler(XANAX) 0.5 MG tablet Take 0.5 mg by mouth at bedtime as needed for anxiety.   Yes [provider]  lisinopril (PRINIVIL,ZESTRIL) 10 MG tablet Take 10 mg by mouth daily.   Yes [provider]  temazepam (RESTORIL) 30 MG capsule Take 30 mg by mouth at bedtime as needed for sleep.   Yes [provider]  HYDROcodone-acetaminophen (NORCO/VICODIN) 5-325 MG tablet Take 1 tablet by mouth every 4 (four) hours as needed. 12/02/17   Raeford RazorKohut, Orly Quimby, MD  ondansetron (ZOFRAN) 4 MG tablet Take 1 tablet (4 mg total) by mouth every 6 (six) hours as needed for nausea or vomiting. 12/02/17   Raeford RazorKohut, Dameka Younker, MD    Family History No family history on file.  Social History Social History   Tobacco Use  . Smoking status: Never Smoker  . Smokeless tobacco: Never Used  Substance Use Topics  . Alcohol use: Yes  . Drug use: Never     Allergies   Sulfa antibiotics   Review of Systems Review of Systems  All systems reviewed and negative,  other than as noted in HPI.  Physical Exam Updated Vital Signs BP (!) 151/87   Pulse 81   Temp 98.2 F (36.8 C) (Oral)   Resp 18   Ht 5\' 2"  (1.575 m)   Wt 47.6 kg (105 lb)   SpO2 99%   BMI 19.20 kg/m   Physical Exam  Constitutional: She appears well-developed and well-nourished. No distress.  HENT:  Head: Normocephalic and atraumatic.  Eyes: Conjunctivae are normal. Right eye exhibits no discharge. Left eye exhibits no discharge.  Neck: Neck supple.  Cardiovascular: Normal rate, regular rhythm and normal heart sounds. Exam reveals no gallop and no friction rub.  No murmur heard. Pulmonary/Chest: Effort normal and breath sounds normal. No respiratory distress.  Abdominal: Soft. She exhibits no distension. There is tenderness.  Epigastric to right upper quadrant tenderness without rebound or guarding.  No distention.  Musculoskeletal: She exhibits no edema or tenderness.  Neurological: She is alert.  Skin: Skin is warm and dry.  Psychiatric: She has a normal mood and affect. Her behavior is normal. Thought content normal.  Nursing note and vitals reviewed.    ED Treatments / Results  Labs (all labs ordered are listed, but only abnormal results are displayed) Labs Reviewed  URINALYSIS, ROUTINE W REFLEX MICROSCOPIC - Abnormal; Notable for the following  components:      Result Value   Color, Urine STRAW (*)    Specific Gravity, Urine <1.005 (*)    Hgb urine dipstick MODERATE (*)    All other components within normal limits  CBC WITH DIFFERENTIAL/PLATELET - Abnormal; Notable for the following components:   Platelets 422 (*)    All other components within normal limits  COMPREHENSIVE METABOLIC PANEL - Abnormal; Notable for the following components:   BUN 24 (*)    Total Protein 6.4 (*)    Alkaline Phosphatase 35 (*)    All other components within normal limits  LIPASE, BLOOD - Abnormal; Notable for the following components:   Lipase 61 (*)    All other components within  normal limits  URINALYSIS, MICROSCOPIC (REFLEX) - Abnormal; Notable for the following components:   Bacteria, UA FEW (*)    Non Squamous Epithelial PRESENT (*)    All other components within normal limits    EKG None  Radiology No results found.   US Abdomen Limited Ruq  Result Date: 12/02/2017 CLINICAL DATA:  Epigastric, right upper quadrant pain EXAM: ULTRASOUND ABDOMEN LIMITED RIGHT UPPER QUADRANT COMPARISON:  None. FINDINGS: Gallbladder: Single mobile 13 mm gallstone. No wall thickening or sonographic Murphy's sign. Common bile duct: Diameter: Normal diameter, 1 mm Liver: No focal lesion identified. Within normal limits in parenchymal echogenicity. Portal vein is patent on color Doppler imaging with normal direction of blood flow towards the liver. IMPRESSION: Cholelithiasis.  No sonographic evidence of acute cholecystitis. Electronically Signed   By: Charlett Nose M.D.   On: 12/02/2017 17:04    Procedures Procedures (including critical care time)  Medications Ordered in ED Medications - No data to display   Initial Impression / Assessment and Plan / ED Course  I have reviewed the triage vital signs and the nursing notes.  Pertinent labs & imaging results that were available during my care of the patient were reviewed by me and considered in my medical decision making (see chart for details).     Symptomatic but otherwise uncomplicated cholelithiasis.  PRN pain medication.  General surgery follow-up.  Return precautions discussed.  Final Clinical Impressions(s) / ED Diagnoses   Final diagnoses:  Epigastric pain  Biliary colic    ED Discharge Orders        Ordered    HYDROcodone-acetaminophen (NORCO/VICODIN) 5-325 MG tablet  Every 4 hours PRN     12/02/17 1708    ondansetron (ZOFRAN) 4 MG tablet  Every 6 hours PRN     12/02/17 1708       Raeford Razor, MD 12/09/17 1709

## 2018-08-03 ENCOUNTER — Encounter (HOSPITAL_BASED_OUTPATIENT_CLINIC_OR_DEPARTMENT_OTHER): Payer: Self-pay

## 2018-08-03 ENCOUNTER — Other Ambulatory Visit: Payer: Self-pay

## 2018-08-03 ENCOUNTER — Emergency Department (HOSPITAL_BASED_OUTPATIENT_CLINIC_OR_DEPARTMENT_OTHER)
Admission: EM | Admit: 2018-08-03 | Discharge: 2018-08-03 | Disposition: A | Discharge status: Home / Self Care | Payer: Medicare Other | Attending: Emergency Medicine | Admitting: Emergency Medicine

## 2018-08-03 DIAGNOSIS — R1013 Epigastric pain: Secondary | ICD-10-CM | POA: Diagnosis not present

## 2018-08-03 DIAGNOSIS — I10 Essential (primary) hypertension: Secondary | ICD-10-CM | POA: Diagnosis not present

## 2018-08-03 DIAGNOSIS — R195 Other fecal abnormalities: Secondary | ICD-10-CM

## 2018-08-03 DIAGNOSIS — R197 Diarrhea, unspecified: Secondary | ICD-10-CM | POA: Diagnosis present

## 2018-08-03 MED ORDER — SUCRALFATE 1 G PO TABS: 1.0000 g | ORAL_TABLET | Freq: Four times a day (QID) | ORAL | 0 refills | Status: AC | PRN

## 2018-08-03 NOTE — ED Provider Notes (Signed)
[facility] Emergency Department Provider Note MRN:  [id number]  Arrival date & time: 08/03/18     Chief Complaint   Diarrhea   History of Present Illness   Rachel Lane is a 73 y.o. year-old female with a history of hypertension, anxiety presenting to the ED with chief complaint of diarrhea.  Patient explains that she wants to make sure that her symptoms are not anything more serious than her known Barrett's esophagus.  She explains that she continues to have intermittent epigastric discomfort, has been present for several months.  Worse with meals.  Was actually improved today because she took her pantoprazole medication today.  Patient endorsing change to her stool today.  Explains that it is not quite diarrhea but that it was a little bit softer than normal and had 2 episodes today.  Not abnormal for her to have 2 bowel movements in 1 day.  Denies blood in the stool, no black stool.  Denies fever, no chest pain or shortness of breath.  Endorsing palpitations intermittently for several months, but none for the past few days.  Patient also endorsing significant anxiety.  Both of her parents have died in the past few years, she also has been heavily involved in the care of her brother who is paraplegic and in the hospital.  She explains that she has a new PCP that wants to switch up her medications and take her off of her benzodiazepine which she has been on for years.  She explains that this is bad timing and she is anxious about this change.  She denies any other drugs or alcohol use, explains that she occasionally has fleeting thoughts of suicide, but has no specific plan and would never follow through on this.  Review of Systems  A complete 10 system review of systems was obtained and all systems are negative except as noted in the HPI and PMH.   Patient's Health History    Past Medical History:  Diagnosis Date  . Anxiety   . High cholesterol   .  Hypertension     Past Surgical History:  Procedure Laterality Date  . BREAST SURGERY      History reviewed. No pertinent family history.  Social History   Socioeconomic History  . Marital status: Single    Spouse name: Not on file  . Number of children: Not on file  . Years of education: Not on file  . Highest education level: Not on file  Occupational History  . Not on file  Social Needs  . Financial resource strain: Not on file  . Food insecurity:    Worry: Not on file    Inability: Not on file  . Transportation needs:    Medical: Not on file    Non-medical: Not on file  Tobacco Use  . Smoking status: Never Smoker  . Smokeless tobacco: Never Used  Substance and Sexual Activity  . Alcohol use: Yes  . Drug use: Never  . Sexual activity: Not on file  Lifestyle  . Physical activity:    Days per week: Not on file    Minutes per session: Not on file  . Stress: Not on file  Relationships  . Social connections:    Talks on phone: Not on file    Gets together: Not on file    Attends religious service: Not on file    Active member of club or organization: Not on file    Attends meetings of clubs  or organizations: Not on file    Relationship status: Not on file  . Intimate partner violence:    Fear of current or ex partner: Not on file    Emotionally abused: Not on file    Physically abused: Not on file    Forced sexual activity: Not on file  Other Topics Concern  . Not on file  Social History Narrative  . Not on file     Physical Exam  Vital Signs and Nursing Notes reviewed Vitals:   08/03/18 1621  BP: 128/78  Pulse: 77  Resp: 16  Temp: 97.8 F (36.6 C)  SpO2: 99%    CONSTITUTIONAL: Well-appearing, NAD NEURO:  Alert and oriented x 3, no focal deficits EYES:  eyes equal and reactive ENT/NECK:  no LAD, no JVD CARDIO: Regular rate, well-perfused, normal S1 and S2 PULM:  CTAB no wheezing or rhonchi GI/GU:  normal bowel sounds, non-distended, non-tender  MSK/SPINE:  No gross deformities, no edema SKIN:  no rash, atraumatic PSYCH:  Appropriate speech and behavior, mildly anxious  Diagnostic and Interventional Summary    Labs Reviewed - No data to display  No orders to display    Medications - No data to display   Procedures Critical Care  ED Course and Medical Decision Making  I have reviewed the triage vital signs and the nursing notes.  Pertinent labs & imaging results that were available during my care of the patient were reviewed by me and considered in my medical decision making (see below for details).  Patient is without any changes to her chronic symptoms today.  Has normal vital signs, is well-appearing, soft abdomen, no black stools or bloody stools, nothing to suggest ulcer or significant intra-abdominal pathology.  Symptoms seem consistent with her known Barrett's esophagus or GERD.  Improved with pantoprazole, which we have encouraged her to take daily.  Currently without indication for laboratory assessment or imaging today.  Has scheduled follow-up with GI, also has scheduled follow-up with her PCP Monday.  Patient symptoms and visit today seems much more related to her anxiety and upcoming change to her chronic medications.  Patient is largely dissatisfied with this new doctor and wants to change her doctor.  Otherwise, no emergent conditions today.  Patient was contracted for safety, promises to return to the emergency department before acting on any suicidal or homicidal thoughts.  She tells me that her PCP is setting her up with a psychiatrist to help talk through her anxiety.  After the discussed management above, the patient was determined to be safe for discharge.  The patient was in agreement with this plan and all questions regarding their care were answered.  ED return precautions were discussed and the patient will return to the ED with any significant worsening of condition.  Elmer Sow. Pilar Plate, MD [facility]  Emergency Medicine [facility] [email or web address]  Final Clinical Impressions(s) / ED Diagnoses     ICD-10-CM   1. Change in stool R19.5   2. Epigastric pain R10.13     ED Discharge Orders         Ordered    sucralfate (CARAFATE) 1 g tablet  4 times daily PRN     08/03/18 1627             Sabas Sous, MD 08/03/18 1628

## 2018-08-03 NOTE — ED Notes (Signed)
Dr. Pilar Plate at bedside assessing pt during triage

## 2018-08-03 NOTE — ED Triage Notes (Addendum)
Pt reports abdominal pain when she eats and diarrhea  x 1 week (soft stool, 2 episdoes today). States she was at her doctor's office yesterday and did not have a fever. She called them today but was unable to speak to someone. She is concerned for a ulcer. Hx of Barrett's esophagus

## 2018-08-03 NOTE — Discharge Instructions (Addendum)
You were evaluated in the Emergency Department and after careful evaluation, we did not find any emergent condition requiring admission or further testing in the hospital.  Your symptoms today seem to be consistent with acid reflux or Barrett's esophagus and there is no evidence for any more significant condition today.  We encourage you to follow-up with your PCP and GI specialist.  You can use the Carafate medication as needed during the day for discomfort.  Please return to the Emergency Department if you experience any worsening of your condition.  We encourage you to follow up with a primary care provider.  Thank you for allowing [place] to be a part of your care.

## 2018-12-17 ENCOUNTER — Encounter (HOSPITAL_BASED_OUTPATIENT_CLINIC_OR_DEPARTMENT_OTHER): Payer: Self-pay

## 2018-12-17 ENCOUNTER — Emergency Department (HOSPITAL_BASED_OUTPATIENT_CLINIC_OR_DEPARTMENT_OTHER): Payer: Medicare Other

## 2018-12-17 ENCOUNTER — Emergency Department (HOSPITAL_BASED_OUTPATIENT_CLINIC_OR_DEPARTMENT_OTHER)
Admission: EM | Admit: 2018-12-17 | Discharge: 2018-12-17 | Disposition: A | Discharge status: Home / Self Care | Payer: Medicare Other | Attending: Emergency Medicine | Admitting: Emergency Medicine

## 2018-12-17 ENCOUNTER — Other Ambulatory Visit: Payer: Self-pay

## 2018-12-17 DIAGNOSIS — I10 Essential (primary) hypertension: Secondary | ICD-10-CM | POA: Diagnosis not present

## 2018-12-17 DIAGNOSIS — S299XXA Unspecified injury of thorax, initial encounter: Secondary | ICD-10-CM | POA: Insufficient documentation

## 2018-12-17 DIAGNOSIS — S40022A Contusion of left upper arm, initial encounter: Secondary | ICD-10-CM | POA: Insufficient documentation

## 2018-12-17 DIAGNOSIS — Y92410 Unspecified street and highway as the place of occurrence of the external cause: Secondary | ICD-10-CM | POA: Diagnosis not present

## 2018-12-17 DIAGNOSIS — T07XXXA Unspecified multiple injuries, initial encounter: Secondary | ICD-10-CM

## 2018-12-17 DIAGNOSIS — R0789 Other chest pain: Secondary | ICD-10-CM

## 2018-12-17 DIAGNOSIS — Y93I9 Activity, other involving external motion: Secondary | ICD-10-CM | POA: Diagnosis not present

## 2018-12-17 DIAGNOSIS — Y998 Other external cause status: Secondary | ICD-10-CM | POA: Diagnosis not present

## 2018-12-17 DIAGNOSIS — Z79899 Other long term (current) drug therapy: Secondary | ICD-10-CM | POA: Insufficient documentation

## 2018-12-17 MED ORDER — TRAMADOL HCL 50 MG PO TABS: 50.0000 mg | ORAL_TABLET | Freq: Four times a day (QID) | ORAL | 0 refills | Status: AC | PRN

## 2018-12-17 NOTE — ED Notes (Signed)
Patient transported to X-ray 
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
## 2018-12-17 NOTE — Discharge Instructions (Addendum)
Expect to be very sore and stiff the next few days.  Chest x-ray without any acute abnormalities.  No underlying lung injury no evidence of any rib fractures.  But she may have some bruised ribs or even a rib fracture that was not seen on the chest x-ray.  Take the tramadol as needed for pain.  Return for the development of any abdominal pain or persistent vomiting this can be a sign of delayed internal injury to the abdomen.
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
## 2020-05-06 ENCOUNTER — Other Ambulatory Visit: Payer: Self-pay

## 2020-05-06 ENCOUNTER — Encounter (HOSPITAL_BASED_OUTPATIENT_CLINIC_OR_DEPARTMENT_OTHER): Payer: Self-pay

## 2020-05-06 ENCOUNTER — Emergency Department (HOSPITAL_BASED_OUTPATIENT_CLINIC_OR_DEPARTMENT_OTHER)
Admission: EM | Admit: 2020-05-06 | Discharge: 2020-05-06 | Disposition: A | Discharge status: Home / Self Care | Payer: Medicare Other | Attending: Emergency Medicine | Admitting: Emergency Medicine

## 2020-05-06 DIAGNOSIS — R1013 Epigastric pain: Secondary | ICD-10-CM | POA: Diagnosis present

## 2020-05-06 DIAGNOSIS — Z5321 Procedure and treatment not carried out due to patient leaving prior to being seen by health care provider: Secondary | ICD-10-CM | POA: Insufficient documentation

## 2020-05-06 DIAGNOSIS — M546 Pain in thoracic spine: Secondary | ICD-10-CM | POA: Insufficient documentation

## 2020-05-06 HISTORY — DX: Gastro-esophageal reflux disease without esophagitis: K21.9

## 2020-05-06 LAB — CBC
HCT: 43.2 % (ref 36.0–46.0)
Hemoglobin: 14.6 g/dL (ref 12.0–15.0)
MCH: 32.3 pg (ref 26.0–34.0)
MCHC: 33.8 g/dL (ref 30.0–36.0)
MCV: 95.6 fL (ref 80.0–100.0)
Platelets: 408 10*3/uL — ABNORMAL HIGH (ref 150–400)
RBC: 4.52 MIL/uL (ref 3.87–5.11)
RDW: 13.2 % (ref 11.5–15.5)
WBC: 6.6 10*3/uL (ref 4.0–10.5)
nRBC: 0 % (ref 0.0–0.2)

## 2020-05-06 LAB — URINALYSIS, ROUTINE W REFLEX MICROSCOPIC
Bilirubin Urine: NEGATIVE
Glucose, UA: NEGATIVE mg/dL
Hgb urine dipstick: NEGATIVE
Ketones, ur: NEGATIVE mg/dL
Leukocytes,Ua: NEGATIVE
Nitrite: NEGATIVE
Protein, ur: NEGATIVE mg/dL
Specific Gravity, Urine: 1.01 (ref 1.005–1.030)
pH: 6 (ref 5.0–8.0)

## 2020-05-06 LAB — COMPREHENSIVE METABOLIC PANEL
ALT: 28 U/L (ref 0–44)
AST: 24 U/L (ref 15–41)
Albumin: 4.2 g/dL (ref 3.5–5.0)
Alkaline Phosphatase: 40 U/L (ref 38–126)
Anion gap: 9 (ref 5–15)
BUN: 17 mg/dL (ref 8–23)
CO2: 23 mmol/L (ref 22–32)
Calcium: 9 mg/dL (ref 8.9–10.3)
Chloride: 103 mmol/L (ref 98–111)
Creatinine, Ser: 0.69 mg/dL (ref 0.44–1.00)
GFR, Estimated: >60 mL/min (ref >60)
Glucose, Bld: 89 mg/dL (ref 70–99)
Potassium: 3.7 mmol/L (ref 3.5–5.1)
Sodium: 135 mmol/L (ref 135–145)
Total Bilirubin: 0.1 mg/dL — ABNORMAL LOW (ref 0.3–1.2)
Total Protein: 6.2 g/dL — ABNORMAL LOW (ref 6.5–8.1)

## 2020-05-06 LAB — LIPASE, BLOOD: Lipase: 48 U/L (ref 11–51)

## 2020-05-06 NOTE — ED Triage Notes (Signed)
Pt c/o epigastric/mid back pain x "couple of months"-NAD-steady gait
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
## 2022-05-15 LAB — COLOGUARD: COLOGUARD: NEGATIVE

## 2024-03-31 ENCOUNTER — Emergency Department (HOSPITAL_BASED_OUTPATIENT_CLINIC_OR_DEPARTMENT_OTHER)
Admission: EM | Admit: 2024-03-31 | Discharge: 2024-03-31 | Disposition: A | Discharge status: Home / Self Care | Attending: Emergency Medicine | Admitting: Emergency Medicine

## 2024-03-31 ENCOUNTER — Other Ambulatory Visit: Payer: Self-pay

## 2024-03-31 ENCOUNTER — Encounter (HOSPITAL_BASED_OUTPATIENT_CLINIC_OR_DEPARTMENT_OTHER): Payer: Self-pay | Admitting: Emergency Medicine

## 2024-03-31 DIAGNOSIS — H578A1 Foreign body sensation, right eye: Secondary | ICD-10-CM | POA: Diagnosis not present

## 2024-03-31 DIAGNOSIS — I10 Essential (primary) hypertension: Secondary | ICD-10-CM | POA: Diagnosis not present

## 2024-03-31 DIAGNOSIS — H5789 Other specified disorders of eye and adnexa: Secondary | ICD-10-CM | POA: Diagnosis present

## 2024-03-31 DIAGNOSIS — Z79899 Other long term (current) drug therapy: Secondary | ICD-10-CM | POA: Insufficient documentation

## 2024-03-31 MED ORDER — FLUORESCEIN SODIUM 1 MG OP STRP
1.0000 | ORAL_STRIP | Freq: Once | OPHTHALMIC | Status: AC
  Administered 2024-03-31: 1 via OPHTHALMIC
  Filled 2024-03-31: qty 1

## 2024-03-31 MED ORDER — TETRACAINE HCL 0.5 % OP SOLN
2.0000 [drp] | Freq: Once | OPHTHALMIC | Status: AC
  Administered 2024-03-31: 2 [drp] via OPHTHALMIC
  Filled 2024-03-31: qty 4

## 2024-03-31 MED ORDER — ERYTHROMYCIN 5 MG/GM OP OINT
TOPICAL_OINTMENT | Freq: Four times a day (QID) | OPHTHALMIC | Status: DC
  Filled 2024-03-31: qty 3.5

## 2024-03-31 NOTE — Discharge Instructions (Signed)
 I would recommend using eye ointment every 6 hours for the next 5 days.  It would be preferable for you to follow-up with an ophthalmologist like Dr. Lavonia listed in discharge paper.  Return to emergency room with new or worsening symptoms.  In the meantime I evaluated avoid rubbing your eye, practice good hand hygiene and avoid make-up/products over this area.

## 2024-03-31 NOTE — ED Triage Notes (Signed)
 Pt reports possible foreign object in R eye, first noticed discomfort yesterday.

## 2024-03-31 NOTE — ED Provider Notes (Signed)
 [place] EMERGENCY DEPARTMENT AT [facility] Provider Note   CSN: [id number] Arrival date & time: 03/31/24  8085     Patient presents with: Foreign Body in Eye   Rachel Lane is a 78 y.o. female.  Patient with past medical history of hypertension, hyperlipidemia presents to emergency room with complaint of foreign body sensation to right eye starting yesterday.  Patient reports that she was at a funeral this weekend and possibly was rubbing her eye causing this sensation.  She has noticed drainage from her eye and to the right side of her nose which she describes as clear.  She has had mild sensitivity to light and mild redness of his eye as well.  No fever and no vision change does not wear contacts. No trauma.     Foreign Body in Eye       Prior to Admission medications   Medication Sig Start Date End Date Taking? Authorizing Provider  ALPRAZolam (XANAX) 0.5 MG tablet Take 0.5 mg by mouth at bedtime as needed for anxiety.    [provider]  HYDROcodone -acetaminophen  (NORCO/VICODIN) 5-325 MG tablet Take 1 tablet by mouth every 4 (four) hours as needed. 12/02/17   Loetta Senior, MD  lisinopril (PRINIVIL,ZESTRIL) 10 MG tablet Take 10 mg by mouth daily.    [provider]  ondansetron  (ZOFRAN ) 4 MG tablet Take 1 tablet (4 mg total) by mouth every 6 (six) hours as needed for nausea or vomiting. 12/02/17   Loetta Senior, MD  sucralfate  (CARAFATE ) 1 g tablet Take 1 tablet (1 g total) by mouth 4 (four) times daily as needed. 08/03/18   Theadore Ozell HERO, MD  temazepam (RESTORIL) 30 MG capsule Take 30 mg by mouth at bedtime as needed for sleep.    [provider]  traMADol  (ULTRAM ) 50 MG tablet Take 1 tablet (50 mg total) by mouth every 6 (six) hours as needed. 12/17/18   Zackowski, Scott, MD    Allergies: Sulfa antibiotics    Review of Systems  Eyes:  Positive for redness.    Updated Vital Signs BP (!) 162/93 (BP Location: Right Arm)   Pulse  83   Temp 97.7 F (36.5 C)   Resp 20   Ht 5' 2 (1.575 m)   Wt 44 kg   SpO2 100%   BMI 17.74 kg/m   Physical Exam Vitals and nursing note reviewed.  Constitutional:      General: She is not in acute distress.    Appearance: She is not toxic-appearing.  HENT:     Head: Normocephalic and atraumatic.  Eyes:     General: Lids are normal. Lids are everted, no foreign bodies appreciated. Vision grossly intact. Gaze aligned appropriately. No scleral icterus.       Right eye: No foreign body or discharge.        Left eye: No foreign body or discharge.     Intraocular pressure: Right eye pressure is 15 mmHg.     Conjunctiva/sclera:     Right eye: Right conjunctiva is injected.     Comments: No increased uptake on  Fluorescein  dye.  Pupils equal and reactive.  No nystagmus.  No pain with eye range of motion and no obvious orbital cellulitis.  Cardiovascular:     Rate and Rhythm: Normal rate and regular rhythm.     Pulses: Normal pulses.     Heart sounds: Normal heart sounds.  Pulmonary:     Effort: Pulmonary effort is normal. No respiratory  distress.     Breath sounds: Normal breath sounds.  Abdominal:     General: Abdomen is flat. Bowel sounds are normal.     Palpations: Abdomen is soft.     Tenderness: There is no abdominal tenderness.  Skin:    General: Skin is warm and dry.     Findings: No lesion.  Neurological:     General: No focal deficit present.     Mental Status: She is alert and oriented to person, place, and time. Mental status is at baseline.     (all labs ordered are listed, but only abnormal results are displayed) Labs Reviewed - No data to display  EKG: None  Radiology: No results found.   Procedures   Medications Ordered in the ED  tetracaine  (PONTOCAINE) 0.5 % ophthalmic solution 2 drop (2 drops Right Eye Given 03/31/24 1937)  fluorescein  ophthalmic strip 1 strip (1 strip Right Eye Given 03/31/24 1938)                                     Medical Decision Making Risk Prescription drug management.   This patient presents to the ED for concern of eye pain, this involves an extensive number of treatment options, and is a complaint that carries with it a high risk of complications and morbidity.  The differential diagnosis includes foreign body, corneal abrasion, corneal ulcer, acute glaucoma, conjunctivitis    Problem List / ED Course / Critical interventions / Medication management  Presents to emergency room with foreign body sensation in right eye.  She does report that she was recently at a funeral and may have been rubbing her eyes during this time.  She denies any specific injury or trauma to her eye.  She continues to have a foreign body sensation and she is noted that her right eye is slightly red and she seems to have right sided runny nose as well.  Her visual acuity exam is overall reassuring and her pupils are equal and reactive with no sign of trauma no nystagmus.  She has no increased uptake on fluorescein  dye exam.  Patient has no IOP. No orbital cellulitis. Will treat has conjunctivitis and give ophthalmology follow-up.  Patient hemodynamically stable and well-appearing.        Final diagnoses:  Sensation of foreign body in right eye    ED Discharge Orders     None          Shermon Keela Rubert N, PA-C 03/31/24 2014    Geraldene Hamilton, MD 04/01/24 1217
